# Patient Record
Sex: Female | Born: 1965 | Race: White | Hispanic: No | Marital: Single | Smoking: Current every day smoker
Health system: Southern US, Community
[De-identification: ages and names within clinical notes are randomized; demographics above are authoritative.]

## PROBLEM LIST (undated history)

## (undated) DIAGNOSIS — G500 Trigeminal neuralgia: Secondary | ICD-10-CM

## (undated) DIAGNOSIS — G629 Polyneuropathy, unspecified: Secondary | ICD-10-CM

## (undated) HISTORY — PX: OTHER SURGICAL HISTORY: SHX169

## (undated) HISTORY — PX: ABDOMINAL HYSTERECTOMY: SHX81

---

## 2004-07-12 ENCOUNTER — Emergency Department: Payer: Self-pay | Admitting: Emergency Medicine

## 2004-07-13 ENCOUNTER — Ambulatory Visit: Payer: Self-pay | Admitting: Emergency Medicine

## 2004-09-22 ENCOUNTER — Inpatient Hospital Stay: Payer: Self-pay | Admitting: Unknown Physician Specialty

## 2004-09-29 ENCOUNTER — Ambulatory Visit: Payer: Self-pay | Admitting: Unknown Physician Specialty

## 2004-10-07 ENCOUNTER — Ambulatory Visit: Payer: Self-pay | Admitting: Unknown Physician Specialty

## 2005-08-26 ENCOUNTER — Emergency Department: Payer: Self-pay | Admitting: Emergency Medicine

## 2006-11-10 ENCOUNTER — Emergency Department: Payer: Self-pay | Admitting: Emergency Medicine

## 2007-06-07 ENCOUNTER — Emergency Department: Payer: Self-pay | Admitting: Emergency Medicine

## 2013-02-16 ENCOUNTER — Inpatient Hospital Stay: Payer: Self-pay | Admitting: Internal Medicine

## 2013-02-16 LAB — APTT: Activated PTT: 45 secs — ABNORMAL HIGH (ref 23.6–35.9)

## 2013-02-16 LAB — CBC
HGB: 13.3 g/dL (ref 12.0–16.0)
MCH: 31.3 pg (ref 26.0–34.0)
MCV: 93 fL (ref 80–100)
RDW: 13.8 % (ref 11.5–14.5)

## 2013-02-16 LAB — COMPREHENSIVE METABOLIC PANEL
Albumin: 4.1 g/dL (ref 3.4–5.0)
BUN: 11 mg/dL (ref 7–18)
Bilirubin,Total: 0.5 mg/dL (ref 0.2–1.0)
Calcium, Total: 8.6 mg/dL (ref 8.5–10.1)
Co2: 19 mmol/L — ABNORMAL LOW (ref 21–32)
EGFR (African American): >60
Osmolality: 277 (ref 275–301)
SGOT(AST): 27 U/L (ref 15–37)
SGPT (ALT): 19 U/L (ref 12–78)
Sodium: 138 mmol/L (ref 136–145)
Total Protein: 8.1 g/dL (ref 6.4–8.2)

## 2013-02-16 LAB — URINALYSIS, COMPLETE
Bilirubin,UR: NEGATIVE
Leukocyte Esterase: NEGATIVE
Nitrite: NEGATIVE
Ph: 5 (ref 4.5–8.0)
Protein: NEGATIVE
Specific Gravity: 1.008 (ref 1.003–1.030)
Squamous Epithelial: 4

## 2013-02-16 LAB — ETHANOL
Ethanol %: <0.003 % (ref 0.000–0.080)
Ethanol: <3 mg/dL

## 2013-02-16 LAB — DRUG SCREEN, URINE
Amphetamines, Ur Screen: NEGATIVE (ref ?–1000)
Barbiturates, Ur Screen: NEGATIVE (ref ?–200)
Benzodiazepine, Ur Scrn: NEGATIVE (ref ?–200)
Cannabinoid 50 Ng, Ur ~~LOC~~: NEGATIVE (ref ?–50)
MDMA (Ecstasy)Ur Screen: NEGATIVE (ref ?–500)
Opiate, Ur Screen: NEGATIVE (ref ?–300)
Phencyclidine (PCP) Ur S: NEGATIVE (ref ?–25)

## 2013-02-16 LAB — SALICYLATE LEVEL
Salicylates, Serum: 55.3 mg/dL
Salicylates, Serum: 48.6 mg/dL

## 2013-02-16 LAB — PROTIME-INR
INR: 1.2
Prothrombin Time: 15.3 secs — ABNORMAL HIGH (ref 11.5–14.7)

## 2013-02-16 LAB — ACETAMINOPHEN LEVEL: Acetaminophen: <2 ug/mL

## 2013-02-16 LAB — URINE PH: Ph: 5 (ref 4.5–8.0)

## 2013-02-16 LAB — AMMONIA: Ammonia, Plasma: <25 mcmol/L (ref 11–32)

## 2013-02-17 LAB — BASIC METABOLIC PANEL
Anion Gap: 5 — ABNORMAL LOW (ref 7–16)
Calcium, Total: 8.3 mg/dL — ABNORMAL LOW (ref 8.5–10.1)
Chloride: 111 mmol/L — ABNORMAL HIGH (ref 98–107)
Creatinine: 0.74 mg/dL (ref 0.60–1.30)
EGFR (African American): >60
EGFR (Non-African Amer.): >60
Glucose: 126 mg/dL — ABNORMAL HIGH (ref 65–99)
Osmolality: 283 (ref 275–301)
Potassium: 2.8 mmol/L — ABNORMAL LOW (ref 3.5–5.1)
Sodium: 142 mmol/L (ref 136–145)

## 2013-02-17 LAB — CBC WITH DIFFERENTIAL/PLATELET
Basophil #: 0 10*3/uL (ref 0.0–0.1)
Eosinophil #: 0 10*3/uL (ref 0.0–0.7)
Eosinophil %: 0.1 %
HCT: 34.2 % — ABNORMAL LOW (ref 35.0–47.0)
MCH: 32.4 pg (ref 26.0–34.0)
MCV: 91 fL (ref 80–100)
Monocyte #: 0.5 x10 3/mm (ref 0.2–0.9)
Neutrophil %: 89.8 %
RDW: 13.9 % (ref 11.5–14.5)
WBC: 11.9 10*3/uL — ABNORMAL HIGH (ref 3.6–11.0)

## 2013-02-17 LAB — URINE PH
Ph: 6 (ref 4.5–8.0)
Ph: 6 (ref 4.5–8.0)
Ph: 6 (ref 4.5–8.0)
Ph: 6 (ref 4.5–8.0)
Ph: 6 (ref 4.5–8.0)
Ph: 6 (ref 4.5–8.0)
Ph: 6 (ref 4.5–8.0)

## 2013-02-17 LAB — HEMOGLOBIN A1C: Hemoglobin A1C: 4.9 % (ref 4.2–6.3)

## 2013-02-17 LAB — TROPONIN I
Troponin-I: <0.02 ng/mL
Troponin-I: <0.02 ng/mL

## 2013-02-17 LAB — URINE CULTURE

## 2013-02-17 LAB — SALICYLATE LEVEL
Salicylates, Serum: 29.6 mg/dL — ABNORMAL HIGH
Salicylates, Serum: 16.7 mg/dL — ABNORMAL HIGH

## 2013-02-18 LAB — TROPONIN I: Troponin-I: <0.02 ng/mL

## 2013-02-18 LAB — POTASSIUM: Potassium: 3 mmol/L — ABNORMAL LOW (ref 3.5–5.1)

## 2013-02-18 LAB — SALICYLATE LEVEL: Salicylates, Serum: 3.6 mg/dL — ABNORMAL HIGH

## 2013-02-22 LAB — CULTURE, BLOOD (SINGLE)

## 2014-12-20 NOTE — H&P (Signed)
PATIENT NAME:  Janalyn ShyBAKER, Arcenia A MR#:  [id number] DATE OF BIRTH:  [birth year]  DATE OF ADMISSION:  02/16/2013  PRIMARY CARE PHYSICIAN:  Dr. Quillian QuinceBliss  CHIEF COMPLAINT:  Altered mental status.   HISTORY OF PRESENT ILLNESS: This is a 49 year old female who was brought in by the police because she was swerving on the road. She was arrested for possible intoxication, though when she was at the jail, she was falling asleep at the jail and brought to the ER for medical clearance prior to going back to the jail. In the ER, she was found to have a very elevated salicylate level. She was given charcoal, and bicarb was ordered in a drip, and hospitalist services were contacted for further evaluation. As per the ER physician and the mother, patient has been taking BC powder for trigeminal neuralgia, likely a chronic intoxication. At the time I did see the patient, the patient was very drowsy, moving her arms in the air, and then falling asleep very easily. Unable to get any history from her. History obtained from mother on the phone.   PAST MEDICAL HISTORY:  Trigeminal neuralgia.   PAST SURGICAL HISTORY:  Cyst on the head.   ALLERGIES: In the computer listed as CODEINE GARLIC, SULFA and ONIONS.   MEDICATIONS INCLUDE: Neurontin 600 mg 4 times a day, and BC powder. Unclear how often she is taking it.   SOCIAL HISTORY:  She is a smoker. No alcohol 2 to 3 years. No drug use. 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 She lives with her mother and grandmother.   FAMILY HISTORY:  Mother with hypertension. Father died of lung cancer metastatic to the brain.   REVIEW OF SYSTEMS:  Unable to obtain secondary to altered mental status.   PHYSICAL EXAMINATION: VITAL SIGNS: Temperature 99.4, pulse 113, respirations 20, blood pressure 102/50, pulse ox 95% on room air.  GENERAL:  No respiratory distress, very lethargic.  EYES:  Conjunctivae and lids normal. Pupils equal, round, reactive to light. Unable to test extraocular muscles.   EARS, NOSE, MOUTH AND THROAT:  Tympanic membranes:  No erythema. Nasal mucosa: No erythema. Throat:  No erythema, no exudate seen. Lips and gums:  No lesions.  NECK: No JVD. No bruits. No lymphadenopathy. No thyromegaly. No thyroid nodules palpated.  RESPIRATORY: Lungs clear to auscultation. No use of accessory muscles to breathe. No rhonchi, rales or wheeze heard.  CARDIOVASCULAR SYSTEM: S1, S2. Tachycardic. No gallops, rubs or murmurs heard. Carotid upstroke 2+ bilaterally. No bruits. Dorsalis pedis pulses 2+ bilaterally. No edema of the lower extremity.  ABDOMEN: Soft, nontender. No organomegaly/splenomegaly. Normoactive bowel sounds. No masses felt.  LYMPHATIC:  No lymph nodes in the neck.  MUSCULOSKELETAL: No clubbing, edema or cyanosis.  SKIN:  No ulcers seen.  NEUROLOGIC: Unable to do cranial nerve testing, but patient was moving all of her extremities on her own. Very lethargic. Awakens and does answer some questions, but is hard to understand.  PSYCHIATRIC:  Unable to test secondary to altered mental status.   LABORATORY AND RADIOLOGICAL DATA:  INR 1.2, PTT 45. Salicylates 55.3. White blood cell count 21.5, H and H 13.3 and 39.2, platelet count of 373. Ethanol level negative. Glucose 134, BUN 11, creatinine 0.92, sodium 138, potassium 3.5, chloride 112, CO2 of 19, calcium 8.6. Liver function tests normal. Acetaminophen level negative. Urinalysis:  1+ blood, 1+ ketones. Urine toxicology negative. Initial CT scan of the head showed findings in the right cerebellar hemisphere and further evaluation with IV contrasted study  needed. A repeat CT scan of the head with contrast showed unremarkable CT scan of the head. No evidence of enhancing mass within the posterior fossa. Chest x-ray negative. ABG showed a pH of 7.39, pCO2 of 28, pO2 of 66, bicarb 16.7, O2 saturation 93.2. EKG:  Sinus tachycardia, left atrial enlargement, poor R-wave progression, flattening of T wave laterally.   ASSESSMENT AND  PLAN: 1.  Acute encephalopathy. I think this is secondary to the salicylate poisoning, which a side effect of altered mental status can happen via 3 mechanisms as a direct toxicity of salicylate, neuroglycopenia and cerebral edema. CT scan did not show cerebral edema. I think this is all secondary to the toxicity of the salicylates. Will have to watch clinically, and I need to watch this patient in the CCU.    2.  Salicylate poisoning. The patient did receive charcoal in the Emergency Room. At this point, with the altered mental status, I do want to keep her n.p.o. I do have to watch her vital signs closely in the CCU. Nausea medications p.r.n. Will give bicarb drip to alkalize the urine. Watch the mental status closely. Also have to watch for signs of pulmonary edema, arrhythmia, hypovolemia, thrombocytopenia, and liver function. Will get serial salicylate levels and replace hypokalemia.   3.  Systemic inflammatory response syndrome. No signs of infection at this point. Will hold off on antibiotics and monitor vital signs.   4.  Hypokalemia. Replace potassium in the IV.   5.  Impaired fasting glucose. Will also check a hemoglobin A1c in the morning.   Time spent on admission:  55 minutes.   The patient will be admitted to the Critical Care Unit. The patient is critically ill.    ____________________________ Herschell Dimes. Renae Gloss, MD rjw:mr D: 02/16/2013 21:31:01 ET T: 02/16/2013 22:09:01 ET JOB#: [id number]  cc: Herschell Dimes. Renae Gloss, MD, <Dictator> Burley Saver, MD  Salley Scarlet MD ELECTRONICALLY SIGNED 02/26/2013 15:38

## 2014-12-20 NOTE — Consult Note (Signed)
PATIENT NAME:  Alexis Sanford, [name] MR#:  [id number] DATE OF BIRTH:  10-10-1965  DATE OF CONSULTATION:  02/17/2013  DATE OF ADMISSION: 02/16/2013   CONSULTING PHYSICIAN:  Melady Chow K. Billie Intriago, MD  AGE: 48. SEX: Female. RACE: White.  The patient was seen in room number 142 in consultation. The patient was referred for salicylate poisoning, likely chronic ingestion than overdose  The patient is a [age] year old white female, not employed and currently has to take care of her grandmother and lives with her parents. The patient is brought to the Emergency Room from the jail by the police because she was sent to the jail because she was driving erratic on the road, and the patient reports that she has been taking BC Powder to help with her trigeminal neuralgia. The patient has trigeminal neuralgia, for which she is on Neurontin 600 mg 4 times a day and is being followed by Dr. Quillian QuinceBliss, who is her primary care physician.   PAST PSYCHIATRIC HISTORY: No previous history of inpatient hold psychiatry. No history of suicide attempt but was depressed and had suicidal wishes and thoughts in the past, for which she went to therapy.  ALCOHOL AND DRUGS: Denies. Does admit smoking nicotine cigarettes at a rate of a pack a day for many years.  MENTAL STATUS: The patient is seen lying comfortably in bed. Alert and oriented. Aware of the situation that brought her for [facility] to [facility]. Denies feeling depressed. Denies feeling hopeless or helpless. Denies feeling worthless or useless. No psychosis. Cognition is intact. Memory is intact. Denies any ideas or plans to hurt herself or others. Insight and judgment fair.  IMPRESSION: Salicylate level increased secondary to patient taking it for trigeminal neuralgia and chronic pain, and denies any intentions to hurt herself.   RECOMMENDATIONS: Please discharge the patient when she is medically cleared and stable, as she contracts for safety, and she may go for followup in the local  community at the local mental clinic if she wishes to get help with consultation and therapy.    ____________________________ Jannet MantisSurya K. Guss Bundehalla, MD skc:jm D: 02/17/2013 20:06:18 ET T: 02/17/2013 20:59:58 ET JOB#: [id number]  cc: Monika SalkSurya K. Guss Bundehalla, MD, <Dictator> Beau FannySURYA K Teneisha Gignac MD ELECTRONICALLY SIGNED 02/18/2013 18:27

## 2014-12-20 NOTE — Discharge Summary (Signed)
PATIENT NAME:  Alexis Sanford, Alexis Sanford MR#:  [id number] DATE OF BIRTH:  10/24/1965  DATE OF ADMISSION:  02/16/2013 DATE OF DISCHARGE:  02/18/2013  ADMITTING PHYSICIAN: Alford Highland, MD  DISCHARGING PHYSICIAN: Enid Baas, MD  PRIMARY CARE PHYSICIAN: Clayborn Bigness, MD  CONSULTANTS:  In the hospital: Psychiatric by Dr. Guss Bunde.   DISCHARGE DIAGNOSES: 1.  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  2.  Chronic obstructive pulmonary disease exacerbation.  3.  Tobacco use disorder.  4.  Hypokalemia.  5.  Chronic headaches and trigeminal neuralgia.   DISCHARGE HOME MEDICATIONS: 1.  Gabapentin 600 mg p.o. 4 times a day.  2.  Percocet 5/325 mg 1 to 2 tablets every 6 hours as needed for pain.  3.  Albuterol inhaler 2 puffs q. 6 hours p.r.n. for breathing. 4.  Prednisone taper.   DISCHARGE DIET: Regular diet.   DISCHARGE ACTIVITY: As tolerated.  FOLLOWUP INSTRUCTIONS: Follow up with PCP in 1 to 2 weeks.  LABORATORY AND DIAGNOSTICS:  Prior to discharge: Salicylate level came down to 3.6.  Initial admission level of salicylate was elevated at 48.6. Troponins are negative. WBC 11.9, hemoglobin 12.2, hematocrit 34.2 and platelet count 320. Sodium 142, potassium after replacements came down to 3, chloride 111, bicarb 26, BUN 9, creatinine 0.74, glucose 126 and calcium of 8.3. Hemoglobin A1c 4.9. Blood cultures are negative.   CT of the head with contrast done showing unremarkable CT. No evidence of any enhancing masses in the posterior fossa. This was done because initial CT of head without contrast showed possible findings in the right cerebellar hemisphere and contrast is recommended to rule out any masses.   Last ammonia level was negative, less than 25. Urine tox screen negative. Urinalysis negative for any infection. Acetaminophen level was less than 2.  Alcohol level was negative.  BRIEF HOSPITAL COURSE: Alexis Sanford is a 49 year old female with past medical history  significant for trigeminal neuralgia and chronic headaches for which she uses prescription gabapentin and also has been using over the counter [facility] Powders, about 6 packets for more than 15 years. She has been using a lot lately, however, she was found by the side of the road by police secondary to confusion and altered mental status.  1.  Acute encephalopathy secondary to toxic metabolic encephalopathy. Salicylate level was elevated to more than 48 on admission. CT of the head with and without contrast were negative. Alcohol level was negative. Not sure what triggered it, probably acute on chronic salicylate poisoning. She was hypothermic secondary to that.  She was placed on bicarb drip to alkalize her urine and salicylate excretion. Salicylate level came down to 3.6 by the time of discharge. Her white count was elevated, initially thought to be systemic inflammatory response syndrome. No source of infection identified. Everything was probably secondary stress reaction. Her mental status improved as her salicylate level started improving and she was back to normal. Now that was advised against BC Powders she was started on some Percocet for headache in the hospital and is discharged on some pills at this time. She is also on gabapentin for the same.  2.  Hypokalemia. Potassium was low and that was replaced, likely secondary to alkalosis caused by the bicarb drip.   3.  Tobacco use disorder. The patient was strongly counseled at the that time of discharge, but she was not ready to quit smoking at this time.  4.  COPD exacerbation. She had severe wheezing on admission. She was never diagnosed  with COPD nor used any inhalers at home, but she has chronic history of smoking. She improved with IV Solu-Medrol and inhalers while in the hospital, so she is being discharged on prednisone taper and albuterol inhaler as needed.   Her course has been otherwise uneventful in the hospital.   DISCHARGE CONDITION: Stable.    DISCHARGE DISPOSITION: Home.   TIME SPENT ON DISCHARGE: 40 minutes.  ____________________________ Enid Baasadhika Smt Lokey, MD rk:sb D: 02/19/2013 12:47:46 ET T: 02/19/2013 12:59:04 ET JOB#: [id number]  cc: Enid Baasadhika Texie Tupou, MD, <Dictator> Burley SaverL. Katherine Bliss, MD Enid BaasADHIKA Almin Livingstone MD ELECTRONICALLY SIGNED 02/19/2013 20:41

## 2015-12-21 ENCOUNTER — Emergency Department
Admission: EM | Admit: 2015-12-21 | Discharge: 2015-12-21 | Disposition: A | Discharge status: Home / Self Care | Payer: Self-pay | Attending: Emergency Medicine | Admitting: Emergency Medicine

## 2015-12-21 ENCOUNTER — Encounter: Payer: Self-pay | Admitting: Emergency Medicine

## 2015-12-21 DIAGNOSIS — F17210 Nicotine dependence, cigarettes, uncomplicated: Secondary | ICD-10-CM | POA: Insufficient documentation

## 2015-12-21 DIAGNOSIS — G500 Trigeminal neuralgia: Secondary | ICD-10-CM | POA: Insufficient documentation

## 2015-12-21 DIAGNOSIS — K122 Cellulitis and abscess of mouth: Secondary | ICD-10-CM | POA: Insufficient documentation

## 2015-12-21 HISTORY — DX: Trigeminal neuralgia: G50.0

## 2015-12-21 MED ORDER — PREDNISONE 20 MG PO TABS: ORAL_TABLET | ORAL | Status: DC

## 2015-12-21 MED ORDER — AMOXICILLIN 500 MG PO TABS
500.0000 mg | ORAL_TABLET | Freq: Three times a day (TID) | ORAL | Status: DC
Start: 2015-12-21 — End: 2019-06-29

## 2015-12-21 NOTE — ED Provider Notes (Signed)
[facility] Emergency Department Provider Note  ____________________________________________  Time seen: Approximately 7:16 AM  I have reviewed the triage vital signs and the nursing notes.   HISTORY  Chief Complaint Dental Pain    HPI Alexis Sanford is a 50 y.o. female , NAD, presents to the emergency department with 2-3 day history of right upper gum line pain. States she had 2 teeth removed on Thursday at [facility]. Has continued to have pain since that time even though she has been taking Percocet for her pain. States it has not been helping. Also notes she has right sided trigeminal neuralgia and feels like that has been aggravated from her dental procedure. She is under pain contract with her primary care physician [place]. [place] for her trigeminal neuralgia. Has spoken to her dentist at [facility] who has advised her to seek treatment in the emergency department for pain and to evaluate for any potential infection. Patient notes she has a history of dry socket after dental procedures but is uncertain if she has one at this time. Is not currently on any prophylactic antibiotics. Does continue to smoke cigarettes since her procedure. It was completely mouth rinses but is not swishing and is only allowing the rest to be held in the mouth. Has not had any fevers, chills, body aches. She does feel some fatigue and has been sleeping more often over the last couple of days. Feels she "just can't get herself going".    Past Medical History  Diagnosis Date  . Trigeminal neuralgia     There are no active problems to display for this patient.   Past Surgical History  Procedure Laterality Date  . Abdominal hysterectomy      partail    Current Outpatient Rx  Name  Route  Sig  Dispense  Refill  . amoxicillin (AMOXIL) 500 MG tablet   Oral   Take 1 tablet (500 mg total) by mouth 3 (three) times daily with meals.   30  tablet   0   . predniSONE (DELTASONE) 20 MG tablet      Take 2 tablets by mouth, once daily, for 5 days   10 tablet   0     Allergies Asa and Sulfa antibiotics  No family history on file.  Social History Social History  Substance Use Topics  . Smoking status: Current Every Day Smoker -- 0.50 packs/day    Types: Cigarettes  . Smokeless tobacco: None  . Alcohol Use: None     Review of Systems Constitutional: Positive fatigue. No fever/chills ENT: Positive right upper gum line pain. No sore throat, tongue swelling. Cardiovascular: No chest pain. Respiratory: No cough. No shortness of breath. No wheezing.  Gastrointestinal: No abdominal pain.  No nausea, vomiting.   Musculoskeletal: Positive pain about right upper jaw line extending to temple. Negative for neck pain. No claudication with mastication.  Skin: Negative for rash. Neurological: Negative for headaches, focal weakness or numbness. 10-point ROS otherwise negative.  ____________________________________________   PHYSICAL EXAM:  VITAL SIGNS: ED Triage Vitals  Enc Vitals Group     BP 12/21/15 0627 142/73 mmHg     Pulse Rate 12/21/15 0627 99     Resp 12/21/15 0627 18     Temp 12/21/15 0627 98.2 F (36.8 C)     Temp Source 12/21/15 0627 Oral     SpO2 12/21/15 0627 100 %     Weight 12/21/15 0627 195  lb (88.451 kg)     Height 12/21/15 0627  (1.676 m)     Head Cir --      Peak Flow --      Pain Score 12/21/15 0627 10     Pain Loc --      Pain Edu? --      Excl. in GC? --      Constitutional: Alert and oriented. Well appearing and in no acute distress. Eyes: Conjunctivae are normal. PERRL. EOMI without pain.  Head: Atraumatic. ENT:      Nose: No congestion/rhinnorhea.      Mouth/Throat: Moderate erythema surrounding the extraction sites in the upper right gum line. Patient is tender to palpation in this area. No active oozing or weeping. Mild swelling is noted. No evidence of dry socket at this  time. Mucous membranes are moist. Pharynx without erythema, exudate, swelling. Uvula is midline. Neck: No stridor. Supple with full range of motion. Hematological/Lymphatic/Immunilogical: No cervical lymphadenopathy. Cardiovascular: Normal rate, regular rhythm. Normal S1 and S2.  Good peripheral circulation. Respiratory: Normal respiratory effort without tachypnea or retractions. Lungs CTAB with breath sounds noted in all lung fields. Neurologic:  Normal speech and language. No gross focal neurologic deficits are appreciated.  Skin:  Skin is warm, dry and intact. No rash noted. Psychiatric: Mood and affect are normal. Speech and behavior are normal. Patient exhibits appropriate insight and judgement.   ____________________________________________   LABS  None  ____________________________________________  EKG  None ____________________________________________  RADIOLOGY  None ____________________________________________    PROCEDURES  Procedure(s) performed: None    Medications - No data to display  Per the [facility] controlled substance database, the patient receives 60 tablets of oxycodone-acetaminophen 5/325 mg from Dr. Joen Laura in medicine [place]. #60 tablets were last dispensed on 12/15/2015. ____________________________________________   INITIAL IMPRESSION / ASSESSMENT AND PLAN / ED COURSE  Patient's diagnosis is consistent with cellulitis oral soft tissues and trigeminal neuralgia pain. Discussed with patient that I would not prescribe narcotic pain medicines for her at this time considering she is under narcotic pain contract with her primary care provider. She also just filled #60 tablets of Percocet on April 17. Patient will be discharged home with prescriptions for amoxicillin and prednisone to take as directed to decrease the potential infection as well as to decrease swelling and inflammation in the area of her extraction. Patient is to follow  up with her dentist at the [facility] tomorrow for recheck. She is to follow up with her primary care provider if trigeminal neuralgia pain is unsettled with current medication use.  Patient is given ED precautions to return to the ED for any worsening or new symptoms.      ____________________________________________  FINAL CLINICAL IMPRESSION(S) / ED DIAGNOSES  Final diagnoses:  Cellulitis of oral soft tissues  Trigeminal neuralgia pain      NEW MEDICATIONS STARTED DURING THIS VISIT:  Discharge Medication List as of 12/21/2015  7:32 AM    START taking these medications   Details  amoxicillin (AMOXIL) 500 MG tablet Take 1 tablet (500 mg total) by mouth 3 (three) times daily with meals., Starting 12/21/2015, Until Discontinued, Print    predniSONE (DELTASONE) 20 MG tablet Take 2 tablets by mouth, once daily, for 5 days, Print             Hope Pigeon, PA-C 12/21/15 0810  Myrna Blazer, MD 12/21/15 1620

## 2015-12-21 NOTE — ED Notes (Signed)
Patient here stating that she had 2 teeth removed on Thursday. Patient states that she continues to have pain. Patient states that she was prescribed 10 mg percocet and that she has ran out but that they did not help. Patient reports that she has trigeminal neurologia.

## 2015-12-21 NOTE — ED Notes (Signed)
NAD noted at time of D/C. Pt denies questions or concerns. Pt ambulatory to the lobby at this time. Pt instructed to continue medications, and to follow up with pain management for pain medications. Pt states understanding of taking abx and prednisone.

## 2015-12-21 NOTE — Discharge Instructions (Signed)
Trigeminal Neuralgia Trigeminal neuralgia is a nerve disorder that causes attacks of severe facial pain. The attacks last from a few seconds to several minutes. They can happen for days, weeks, or months and then go away for months or years. Trigeminal neuralgia is also called tic douloureux. CAUSES This condition is caused by damage to a nerve in the face that is called the trigeminal nerve. An attack can be triggered by:  Talking.  Chewing.  Putting on makeup.  Washing your face.  Shaving your face.  Brushing your teeth.  Touching your face. RISK FACTORS This condition is more likely to develop in:  Women.  People who are 35 years of age or older. SYMPTOMS The main symptom of this condition is pain in the jaw, lips, eyes, nose, scalp, forehead, and face. The pain may be intense, stabbing, electric, or shock-like. DIAGNOSIS This condition is diagnosed with a physical exam. A CT scan or MRI may be done to rule out other conditions that can cause facial pain. TREATMENT This condition may be treated with:  Avoiding the things that trigger your attacks.  Pain medicine.  Surgery. This may be done in severe cases if other medical treatment does not provide relief. HOME CARE INSTRUCTIONS  Take over-the-counter and prescription medicines only as told by your health care provider.  If you wish to get pregnant, talk with your health care provider before you start trying to get pregnant.  Avoid the things that trigger your attacks. It may help to:  Chew on the unaffected side of your mouth.  Avoid touching your face.  Avoid blasts of hot or cold air. SEEK MEDICAL CARE IF:  Your pain medicine is not helping.  You develop new, unexplained symptoms, such as:  Double vision.  Facial weakness.  Changes in hearing or balance.  You become pregnant. SEEK IMMEDIATE MEDICAL CARE IF:  Your pain is unbearable, and your pain medicine does not help.   This information is not  intended to replace advice given to you by your health care provider. Make sure you discuss any questions you have with your health care provider.   Document Released: 08/13/2000 Document Revised: 05/07/2015 Document Reviewed: 12/09/2014 Elsevier Interactive Patient Education 2016 Elsevier Inc.   Dental Abscess    A dental abscess is pus in or around a tooth.  HOME CARE  Take medicines only as told by your dentist.  If you were prescribed antibiotic medicine, finish all of it even if you start to feel better.  Rinse your mouth (gargle) often with salt water.  Do not drive or use heavy machinery, like a lawn mower, while taking pain medicine.  Do not apply heat to the outside of your mouth.  Keep all follow-up visits as told by your dentist. This is important. GET HELP IF:  Your pain is worse, and medicine does not help. GET HELP RIGHT AWAY IF:  You have a fever or chills.  Your symptoms suddenly get worse.  You have a very bad headache.  You have problems breathing or swallowing.  You have trouble opening your mouth.  You have puffiness (swelling) in your neck or around your eye. This information is not intended to replace advice given to you by your health care provider. Make sure you discuss any questions you have with your health care provider.  Document Released: 12/31/2014 Document Reviewed: 12/31/2014  Elsevier Interactive Patient Education 2016 Elsevier Inc.    Dental Dry Socket    A dental dry socket can  happen after a tooth is pulled. When a tooth gets pulled, it leaves a hole (socket). Normally, blood fills up the hole and hardens (clots). A dry socket happens if blood gets removed from the hole or does not fill the hole.  HOME CARE  Follow your dentist's instructions.  Only take medicines as told by your dentist.  Take your medicine (antibiotics) as told if you are given medicine. Finish them even if you start to feel better.  Wait 1 day to rinse your mouth with  warm salt water. Spit water out very gently.  Avoid bubbly (carbonated) drinks.  Avoid alcohol.  Avoid smoking. GET HELP RIGHT AWAY IF:  Your medicine does not help your pain.  You have puffiness (swelling), severe pain, or you cannot stop bleeding.  You have a temperature by mouth above 102 F (38.9 C), not controlled by medicine.  You have trouble swallowing or cannot open your mouth.  You have severe problems (symptoms). MAKE SURE YOU:  Understand these instructions.  Will watch your condition.  Will get help right away if you are not doing well or get worse. This information is not intended to replace advice given to you by your health care provider. Make sure you discuss any questions you have with your health care provider.  Document Released: 08/16/2005 Document Revised: 09/06/2014 Document Reviewed: 03/31/2015  Elsevier Interactive Patient Education [employer] Elsevier Inc.

## 2016-04-16 ENCOUNTER — Emergency Department: Payer: Self-pay

## 2016-04-16 ENCOUNTER — Emergency Department
Admission: EM | Admit: 2016-04-16 | Discharge: 2016-04-16 | Disposition: A | Discharge status: Home / Self Care | Payer: Self-pay | Attending: Emergency Medicine | Admitting: Emergency Medicine

## 2016-04-16 ENCOUNTER — Encounter (INDEPENDENT_AMBULATORY_CARE_PROVIDER_SITE_OTHER): Payer: Self-pay

## 2016-04-16 ENCOUNTER — Encounter: Payer: Self-pay | Admitting: Emergency Medicine

## 2016-04-16 DIAGNOSIS — S62632A Displaced fracture of distal phalanx of right middle finger, initial encounter for closed fracture: Secondary | ICD-10-CM | POA: Insufficient documentation

## 2016-04-16 DIAGNOSIS — S62639B Displaced fracture of distal phalanx of unspecified finger, initial encounter for open fracture: Secondary | ICD-10-CM

## 2016-04-16 DIAGNOSIS — S61219A Laceration without foreign body of unspecified finger without damage to nail, initial encounter: Secondary | ICD-10-CM

## 2016-04-16 DIAGNOSIS — F17210 Nicotine dependence, cigarettes, uncomplicated: Secondary | ICD-10-CM | POA: Insufficient documentation

## 2016-04-16 DIAGNOSIS — W228XXA Striking against or struck by other objects, initial encounter: Secondary | ICD-10-CM | POA: Insufficient documentation

## 2016-04-16 DIAGNOSIS — S6000XA Contusion of unspecified finger without damage to nail, initial encounter: Secondary | ICD-10-CM

## 2016-04-16 DIAGNOSIS — Y939 Activity, unspecified: Secondary | ICD-10-CM | POA: Insufficient documentation

## 2016-04-16 DIAGNOSIS — Y929 Unspecified place or not applicable: Secondary | ICD-10-CM | POA: Insufficient documentation

## 2016-04-16 DIAGNOSIS — Y999 Unspecified external cause status: Secondary | ICD-10-CM | POA: Insufficient documentation

## 2016-04-16 MED ORDER — LIDOCAINE HCL (PF) 1 % IJ SOLN
2.0000 mL | Freq: Once | INTRAMUSCULAR | Status: AC
  Administered 2016-04-16: 2 mL
  Filled 2016-04-16: qty 5

## 2016-04-16 MED ORDER — CEPHALEXIN 500 MG PO CAPS: 500.0000 mg | ORAL_CAPSULE | Freq: Three times a day (TID) | ORAL | 0 refills | Status: AC

## 2016-04-16 NOTE — ED Provider Notes (Signed)
[facility]-EMERGENCY DEPARTMENT Provider Note   CSN: [id number] Arrival date & time: 04/16/16  1653     History   Chief Complaint Chief Complaint  Patient presents with  . Finger Injury    HPI Alexis Sanford is a 50 y.o. female who presents today for evaluation of crush injury to the right third digit. Patient slammed her finger in a car door just prior to arrival. Pain is 8 out of 10. She suffered laceration to the tip of the finger. No numbness or tingling. No tendon deficits. Tetanus is up-to-date. Bleeding controlled.  HPI  Past Medical History:  Diagnosis Date  . Trigeminal neuralgia     There are no active problems to display for this patient.   Past Surgical History:  Procedure Laterality Date  . ABDOMINAL HYSTERECTOMY     partail    OB History    No data available       Home Medications    Prior to Admission medications   Medication Sig Start Date End Date Taking? Authorizing Provider  amoxicillin (AMOXIL) 500 MG tablet Take 1 tablet (500 mg total) by mouth 3 (three) times daily with meals. 12/21/15   Jami L Hagler, PA-C  cephALEXin (KEFLEX) 500 MG capsule Take 1 capsule (500 mg total) by mouth 3 (three) times daily. 04/16/16 04/26/16  Evon Slackhomas C Gaines, PA-C  predniSONE (DELTASONE) 20 MG tablet Take 2 tablets by mouth, once daily, for 5 days 12/21/15   Hope PigeonJami L Hagler, PA-C    Family History History reviewed. No pertinent family history.  Social History Social History  Substance Use Topics  . Smoking status: Current Every Day Smoker    Packs/day: 0.50    Types: Cigarettes  . Smokeless tobacco: Not on file  . Alcohol use Not on file     Allergies   Asa [aspirin] and Sulfa antibiotics   Review of Systems Review of Systems  Constitutional: Negative for activity change, chills, fatigue and fever.  HENT: Negative for congestion, sinus pressure and sore throat.   Eyes: Negative for visual disturbance.  Respiratory: Negative for cough, chest tightness and  shortness of breath.   Cardiovascular: Negative for chest pain and leg swelling.  Gastrointestinal: Negative for abdominal pain, diarrhea, nausea and vomiting.  Genitourinary: Negative for dysuria.  Musculoskeletal: Positive for arthralgias and joint swelling. Negative for gait problem.  Skin: Negative for rash.  Neurological: Negative for weakness, numbness and headaches.  Hematological: Negative for adenopathy.  Psychiatric/Behavioral: Negative for agitation, behavioral problems and confusion.     Physical Exam Updated Vital Signs BP 124/74   Pulse 86   Temp 98.5 F (36.9 C) (Oral)   Resp 16   Ht 5\' 6"  (1.676 m)   Wt 82.6 kg   SpO2 98%   BMI 29.38 kg/m   Physical Exam  Constitutional: She is oriented to person, place, and time. She appears well-developed and well-nourished. No distress.  HENT:  Head: Normocephalic and atraumatic.  Mouth/Throat: Oropharynx is clear and moist.  Eyes: EOM are normal. Pupils are equal, round, and reactive to light. Right eye exhibits no discharge. Left eye exhibits no discharge.  Neck: Normal range of motion. Neck supple.  Cardiovascular: Normal rate and intact distal pulses.   Pulmonary/Chest: Effort normal. No respiratory distress.  Musculoskeletal:  Examination of the right third digit shows patient has a volar laceration along the distal pad that is 2 cm transverse, there is no sign of foreign body. Wound is thoroughly irrigated. No subungual hematoma or nail  injury. No tendon deficits noted. She is tender to palpation with mild swelling.  Neurological: She is alert and oriented to person, place, and time. She has normal reflexes.  Skin: Skin is warm and dry.  Psychiatric: She has a normal mood and affect. Her behavior is normal. Thought content normal.     ED Treatments / Results  Labs (all labs ordered are listed, but only abnormal results are displayed) Labs Reviewed - No data to display  EKG  EKG Interpretation None        Radiology Dg Finger Middle Right  Result Date: 04/16/2016 CLINICAL DATA:  Middle finger caught in door. Laceration to the digit. EXAM: RIGHT MIDDLE FINGER 2+V COMPARISON:  None. FINDINGS: Minimally displaced fracture involving the middle finger tuft. No other fractures. Laceration involving the tip of the middle finger. The middle finger PIP and DIP joints are intact. IMPRESSION: Minimally displaced fracture involving the middle finger tuft. Electronically Signed   By: Richarda OverlieAdam  Henn M.D.   On: 04/16/2016 17:28    Procedures Procedures (including critical care time) LACERATION REPAIR Performed by: Patience MuscaGAINES, THOMAS CHRISTOPHER Authorized by: Patience MuscaGAINES, THOMAS CHRISTOPHER Consent: Verbal consent obtained. Risks and benefits: risks, benefits and alternatives were discussed Consent given by: patient Patient identity confirmed: provided demographic data Prepped and Draped in normal sterile fashion Wound explored  Laceration Location: Right third digit  Laceration Length: 2 cm  No Foreign Bodies seen or palpated  Anesthesia: local infiltration  Local anesthetic: lidocaine 1 % without epinephrine  Anesthetic total: 2 ml  Irrigation method: syringe Amount of cleaning: standard  Skin closure: Simple interrupted 4-0 nylon   Number of sutures: 3   Technique: Simple interrupted   Patient tolerance: Patient tolerated the procedure well with no immediate complications.  SPLINT APPLICATION Date/Time: 6:14 PM Authorized by: Patience MuscaGAINES, THOMAS CHRISTOPHER Consent: Verbal consent obtained. Risks and benefits: risks, benefits and alternatives were discussed Consent given by: patient Splint applied by: ED tech Location details: Right third digit  Splint type: Aluminum/foam  Supplies used: Aluminum/foam  Post-procedure: The splinted body part was neurovascularly unchanged following the procedure. Patient tolerance: Patient tolerated the procedure well with no immediate  complications.     Medications Ordered in ED Medications  lidocaine (PF) (XYLOCAINE) 1 % injection 2 mL (2 mLs Infiltration Given 04/16/16 1745)     Initial Impression / Assessment and Plan / ED Course  I have reviewed the triage vital signs and the nursing notes.  Pertinent labs & imaging results that were available during my care of the patient were reviewed by me and considered in my medical decision making (see chart for details).  Clinical Course    50 year old female with crush injury to the right third digit. She suffered laceration and tuft fracture. She is placed on oral antibiotics. Wound is thoroughly irrigated and repaired with #3 4-0 nylon sutures. She will continue with chronic pain medication, recommended adding ibuprofen as needed. She is placed into a finger splint, she will follow-up in 7-10 days for suture removal. She is educated on wound care and signs and symptoms to follow up to the emergency department for.  Final Clinical Impressions(s) / ED Diagnoses   Final diagnoses:  Finger laceration, initial encounter  Open fracture of tuft of distal phalanx of finger, initial encounter  Finger contusion, initial encounter    New Prescriptions New Prescriptions   CEPHALEXIN (KEFLEX) 500 MG CAPSULE    Take 1 capsule (500 mg total) by mouth 3 (three) times daily.  Evon Slack, PA-C 04/16/16 1814    Sharman Cheek, MD 04/16/16 [phone]

## 2016-04-16 NOTE — Discharge Instructions (Signed)
Please keep finger clean and dry. Use a splint to protect the tip of the finger. Take antibiotic as prescribed. Ibuprofen as needed for pain.
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
## 2016-04-16 NOTE — ED Triage Notes (Signed)
Pt slammed 3rd digit right hand in door. Laceration across finger noted.

## 2016-04-16 NOTE — ED Notes (Signed)
Reviewed d/c instructions, follow-up care, and prescription with pt. Pt verbalized understanding 

## 2016-09-20 DIAGNOSIS — G500 Trigeminal neuralgia: Secondary | ICD-10-CM | POA: Insufficient documentation

## 2017-06-01 DIAGNOSIS — G44229 Chronic tension-type headache, not intractable: Secondary | ICD-10-CM | POA: Insufficient documentation

## 2017-10-04 DIAGNOSIS — F17200 Nicotine dependence, unspecified, uncomplicated: Secondary | ICD-10-CM | POA: Insufficient documentation

## 2019-06-29 ENCOUNTER — Other Ambulatory Visit: Payer: Self-pay

## 2019-06-29 ENCOUNTER — Emergency Department: Payer: Self-pay

## 2019-06-29 ENCOUNTER — Emergency Department
Admission: EM | Admit: 2019-06-29 | Discharge: 2019-06-29 | Disposition: A | Discharge status: Home / Self Care | Payer: Self-pay | Attending: Emergency Medicine | Admitting: Emergency Medicine

## 2019-06-29 DIAGNOSIS — Z79899 Other long term (current) drug therapy: Secondary | ICD-10-CM | POA: Insufficient documentation

## 2019-06-29 DIAGNOSIS — M542 Cervicalgia: Secondary | ICD-10-CM | POA: Diagnosis not present

## 2019-06-29 DIAGNOSIS — Z87891 Personal history of nicotine dependence: Secondary | ICD-10-CM | POA: Insufficient documentation

## 2019-06-29 DIAGNOSIS — R202 Paresthesia of skin: Secondary | ICD-10-CM | POA: Diagnosis not present

## 2019-06-29 HISTORY — DX: Polyneuropathy, unspecified: G62.9

## 2019-06-29 MED ORDER — IBUPROFEN 800 MG PO TABS: 800.0000 mg | ORAL_TABLET | Freq: Three times a day (TID) | ORAL | 0 refills | Status: DC | PRN

## 2019-06-29 MED ORDER — HYDROCODONE-ACETAMINOPHEN 5-325 MG PO TABS
1.0000 | ORAL_TABLET | Freq: Once | ORAL | Status: AC
  Administered 2019-06-29: 1 via ORAL
  Filled 2019-06-29: qty 1

## 2019-06-29 MED ORDER — CYCLOBENZAPRINE HCL 10 MG PO TABS
10.0000 mg | ORAL_TABLET | Freq: Once | ORAL | Status: AC
  Administered 2019-06-29: 10 mg via ORAL
  Filled 2019-06-29: qty 1
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
## 2019-06-29 NOTE — Discharge Instructions (Addendum)
You were seen today for acute neck pain, tingling in left arm and tingling in left leg. CT of neck and lumbar spine do not show any acute injury. It looks like you may have a slightly bulging disc at L4-5 but this appears chronic. We recommend rest, heat and massage. Take your Methocarbamol as directed. I have given you a RX for Ibuprofen 800 mg TID prn- take with food. Avoid other NSAID's OTC. Follow up with your PCP in 1 week, sooner if needed
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
## 2019-10-09 NOTE — Progress Notes (Signed)
Patient pre-screened for BCCCP eligibility. Two patient identifiers used for verification that I was speaking to correct patient.  Patient to present directly to [facility] on 10/10/19 at 10:00.

## 2019-10-10 ENCOUNTER — Ambulatory Visit
Admission: RE | Admit: 2019-10-10 | Discharge: 2019-10-10 | Disposition: A | Discharge status: Home / Self Care | Payer: Self-pay | Source: Ambulatory Visit | Attending: Oncology | Admitting: Oncology

## 2019-10-10 ENCOUNTER — Other Ambulatory Visit: Payer: Self-pay

## 2019-10-10 ENCOUNTER — Encounter: Payer: Self-pay | Admitting: *Deleted

## 2019-10-10 ENCOUNTER — Ambulatory Visit: Payer: Self-pay | Attending: Oncology | Admitting: *Deleted

## 2019-10-10 VITALS — BP 129/72 | HR 66 | Temp 96.7°F | Resp 18 | Ht 64.0 in | Wt 131.0 lb

## 2019-10-10 DIAGNOSIS — Z0000 Encounter for general adult medical examination without abnormal findings: Secondary | ICD-10-CM

## 2019-10-10 NOTE — Progress Notes (Signed)
  Subjective:     Patient ID: Alexis Sanford, female   DOB: 1966-02-13, 54 y.o.   MRN: [id number]  HPI   Review of Systems     Objective:   Physical Exam Chest:     Breasts:        Right: No swelling, bleeding, inverted nipple, mass, nipple discharge, skin change or tenderness.        Left: No swelling, bleeding, inverted nipple, mass, nipple discharge, skin change or tenderness.    Lymphadenopathy:     Upper Body:     Right upper body: No supraclavicular or axillary adenopathy.     Left upper body: No supraclavicular or axillary adenopathy.        Assessment:     54 year old White female presents to [facility] for clinical breast exam, baseline mammogram and pap smear.  Clinical breast exam unremarkable.  Taught self breast awareness.  Patient states history of hysterectomy for fibroids.  She does not know if she has her cervix.  She thinks she has one ovary.  I did review her pathology report from 2006, which states the uterus and cervix was removed.  Based on the pathology and BCCCP guidelines, the patient does not require a pap smear since she does not have cervix.  Offered a pelvic exam only, since she does not have a cervix.  She refused a pelvic exam today.  She states if her doctor request she have one, she will have one completed later.  Reviewed that since there is no recommended screening for cervical cancer the only thing we can do is a pelvic exam.  Patient does have a family history of ovarian cancer in a maternal aunt. Patient has been screened for eligibility.  She does not have any insurance, Medicare or Medicaid.  She also meets financial eligibility.   Risk Assessment    Risk Scores      10/10/2019   Last edited by: Alexis Presto, RN   5-year risk: 1.3 %   Lifetime risk: 9.3 %             Plan:     Screening mammogram ordered.  Will follow-up per BCCCP protocol.

## 2019-10-11 ENCOUNTER — Other Ambulatory Visit: Payer: Self-pay | Admitting: *Deleted

## 2019-10-11 DIAGNOSIS — N63 Unspecified lump in unspecified breast: Secondary | ICD-10-CM

## 2019-10-22 ENCOUNTER — Ambulatory Visit
Admission: RE | Admit: 2019-10-22 | Discharge: 2019-10-22 | Disposition: A | Discharge status: Home / Self Care | Payer: Self-pay | Source: Ambulatory Visit | Attending: Oncology | Admitting: Oncology

## 2019-10-22 ENCOUNTER — Other Ambulatory Visit: Payer: Self-pay | Admitting: *Deleted

## 2019-10-22 DIAGNOSIS — N63 Unspecified lump in unspecified breast: Secondary | ICD-10-CM

## 2019-10-22 DIAGNOSIS — R920 Mammographic microcalcification found on diagnostic imaging of breast: Secondary | ICD-10-CM

## 2019-10-29 ENCOUNTER — Ambulatory Visit
Admission: RE | Admit: 2019-10-29 | Discharge: 2019-10-29 | Disposition: A | Discharge status: Home / Self Care | Payer: Self-pay | Source: Ambulatory Visit | Attending: Oncology | Admitting: Oncology

## 2019-10-29 DIAGNOSIS — R920 Mammographic microcalcification found on diagnostic imaging of breast: Secondary | ICD-10-CM | POA: Insufficient documentation

## 2019-10-29 HISTORY — PX: BREAST BIOPSY: SHX20

## 2019-10-30 LAB — SURGICAL PATHOLOGY

## 2019-11-02 ENCOUNTER — Other Ambulatory Visit: Payer: Self-pay | Admitting: *Deleted

## 2019-11-02 ENCOUNTER — Encounter: Payer: Self-pay | Admitting: *Deleted

## 2019-11-02 DIAGNOSIS — N63 Unspecified lump in unspecified breast: Secondary | ICD-10-CM

## 2019-11-02 NOTE — Progress Notes (Signed)
Letter mailed to inform patient of her 6 month follow up mammogram appointment on 05/08/20 @ 9:40.

## 2020-05-08 ENCOUNTER — Ambulatory Visit
Admission: RE | Admit: 2020-05-08 | Discharge: 2020-05-08 | Disposition: A | Discharge status: Home / Self Care | Payer: No Typology Code available for payment source | Source: Ambulatory Visit | Attending: Oncology | Admitting: Oncology

## 2020-05-08 DIAGNOSIS — N63 Unspecified lump in unspecified breast: Secondary | ICD-10-CM

## 2020-06-19 NOTE — Progress Notes (Signed)
Copy to [facility].  Patient scheduled for annual and 6 month follow-up BCCCP appointment 11/26/20.  Appointment reminder mailed.

## 2020-11-26 ENCOUNTER — Ambulatory Visit
Admission: RE | Admit: 2020-11-26 | Discharge: 2020-11-26 | Disposition: A | Discharge status: Home / Self Care | Payer: Self-pay | Source: Ambulatory Visit | Attending: Oncology | Admitting: Oncology

## 2020-11-26 ENCOUNTER — Other Ambulatory Visit: Payer: Self-pay

## 2020-11-26 ENCOUNTER — Encounter: Payer: Self-pay | Admitting: *Deleted

## 2020-11-26 ENCOUNTER — Ambulatory Visit: Payer: Self-pay | Attending: Oncology | Admitting: *Deleted

## 2020-11-26 VITALS — BP 132/67 | HR 68 | Temp 98.2°F | Ht 66.14 in | Wt 161.3 lb

## 2020-11-26 DIAGNOSIS — R920 Mammographic microcalcification found on diagnostic imaging of breast: Secondary | ICD-10-CM

## 2020-11-26 NOTE — Patient Instructions (Signed)
Gave patient hand-out, Women Staying Healthy, Active and Well from BCCCP, with education on breast health, pap smears, heart and colon health. 

## 2020-11-26 NOTE — Progress Notes (Signed)
  Subjective:     Patient ID: Alexis Sanford, female   DOB: May 08, 1966, 55 y.o.   MRN: [id number]  HPI   Review of Systems     Objective:   Physical Exam Chest:  Breasts:     Right: No swelling, bleeding, inverted nipple, mass, nipple discharge, skin change, tenderness, axillary adenopathy or supraclavicular adenopathy.     Left: No swelling, bleeding, inverted nipple, mass, nipple discharge, skin change, tenderness, axillary adenopathy or supraclavicular adenopathy.    Lymphadenopathy:     Upper Body:     Right upper body: No supraclavicular or axillary adenopathy.     Left upper body: No supraclavicular or axillary adenopathy.        Assessment:     55 year female returns to [facility] for 6 month follow up and annual screening for calcification.  Clinical breast exam unremarkable.  Taught self breast awareness.  History of hysterectomy.  Pap deferred per protocol.  Patient has been screened for eligibility.  She does not have any insurance, Medicare or Medicaid.  She also meets financial eligibility.   Risk Assessment    Risk Scores      11/26/2020 10/10/2019   Last edited by: Scarlett Presto, RN Scarlett Presto, RN   5-year risk: 1.3 % 1.3 %   Lifetime risk: 9.1 % 9.3 %            Plan:     Bilateral diagnostic mammogram and ultrasound for follow up of bilateral calcification.  Will follow up per BCCCP protocol.

## 2020-12-01 ENCOUNTER — Encounter: Payer: Self-pay | Admitting: *Deleted

## 2020-12-01 NOTE — Progress Notes (Signed)
Letter mailed to inform patient of her next mammogram on 12/02/21 @ 8:00.

## 2021-09-14 IMAGING — MG DIGITAL SCREENING BILAT W/ TOMO W/ CAD
8 series · 9 of 24 positions shown · non-contrast
Comparison: None.

CLINICAL DATA: Screening.

EXAM:
DIGITAL SCREENING BILATERAL MAMMOGRAM WITH TOMO AND CAD

[R MLO synth-2D]
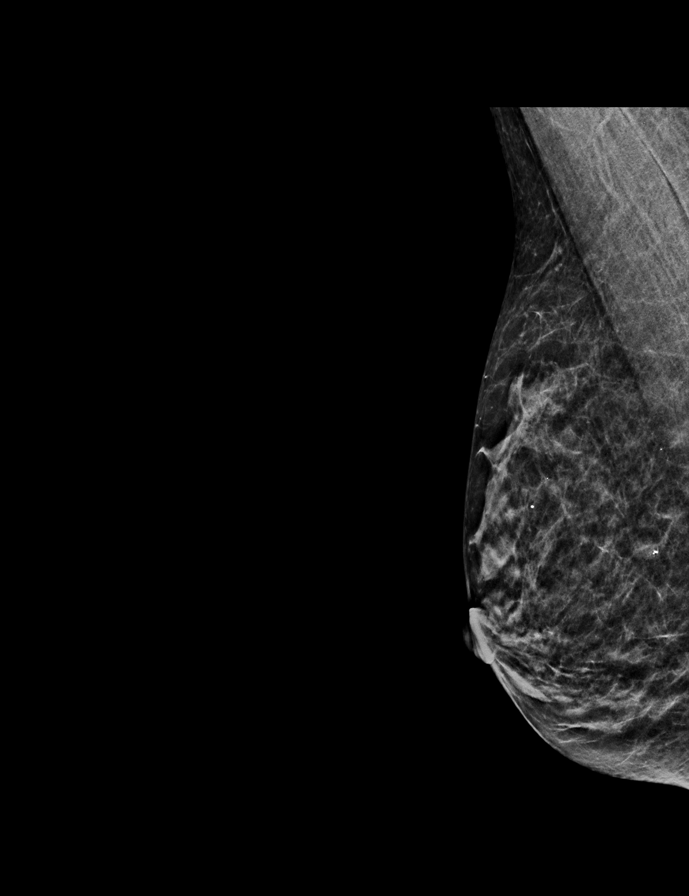

[R CC synth-2D]
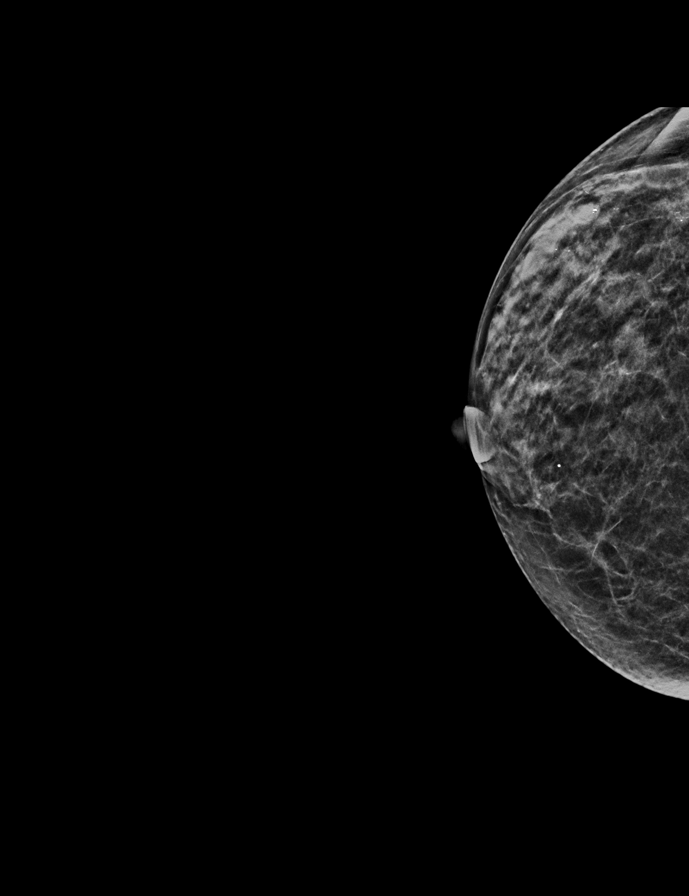

[L MLO synth-2D]
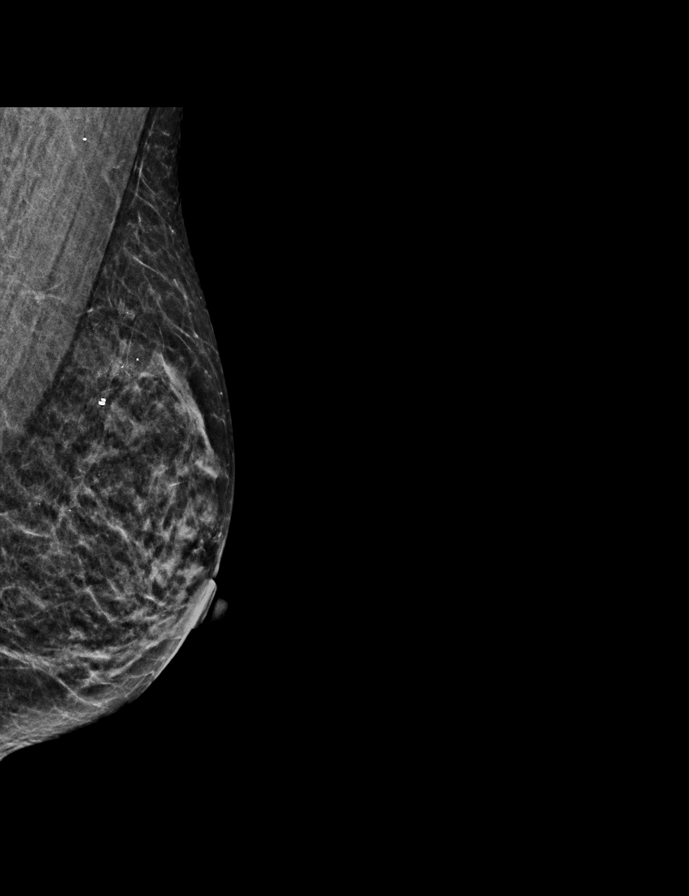

[L CC synth-2D]
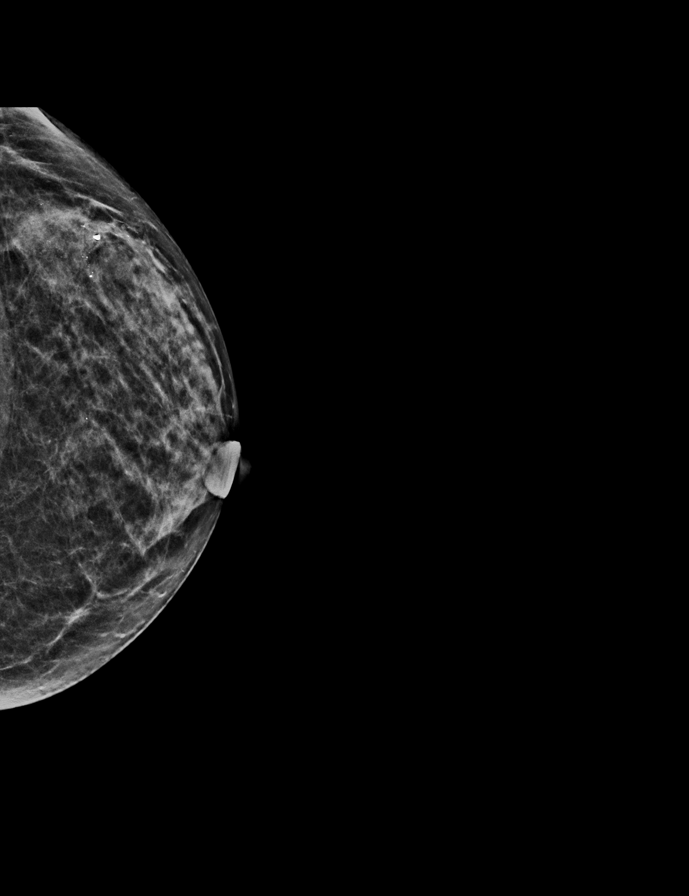

[L MLO tomo · 2 of 45 frames shown]
[frame 15/45]
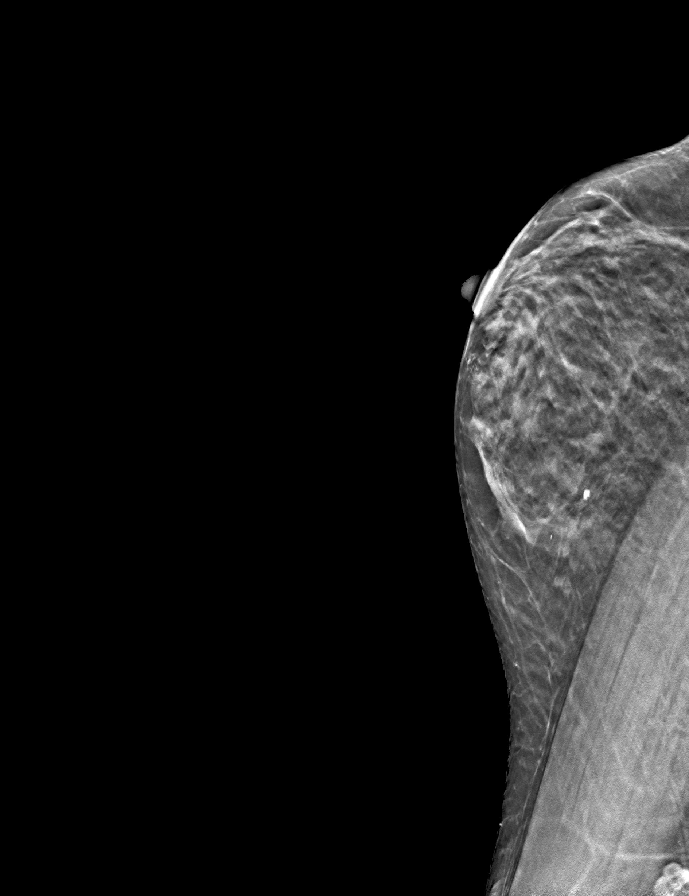
[frame 23/45]
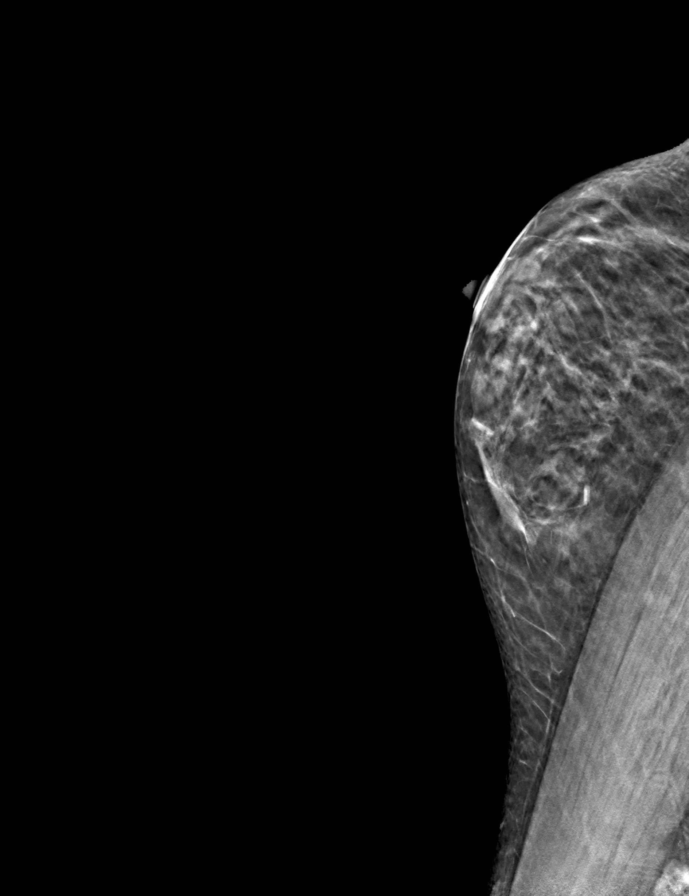

[R MLO tomo · tomo slice 21/40.0]
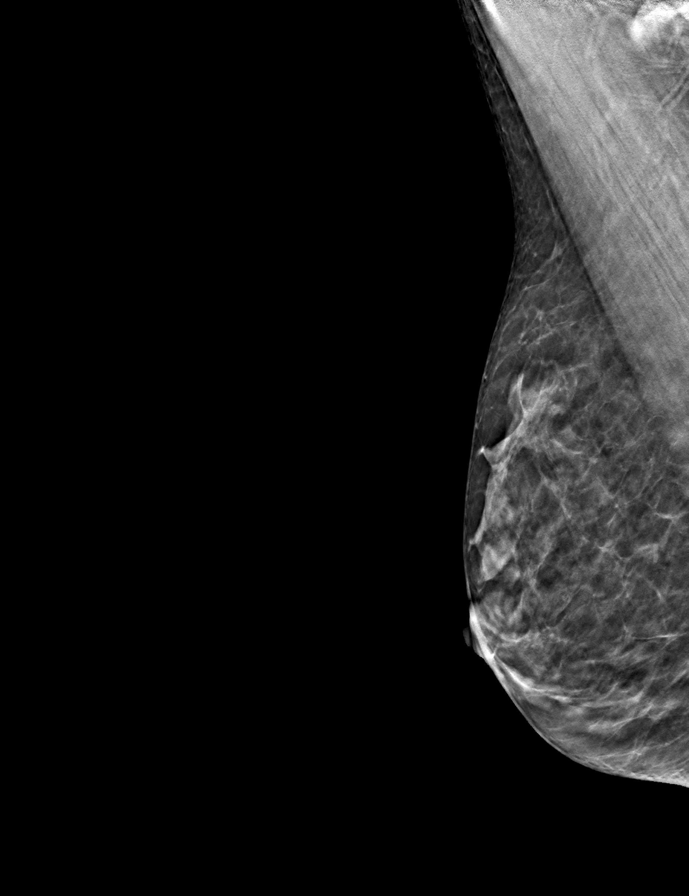

[R CC tomo · tomo slice 19/38.0]
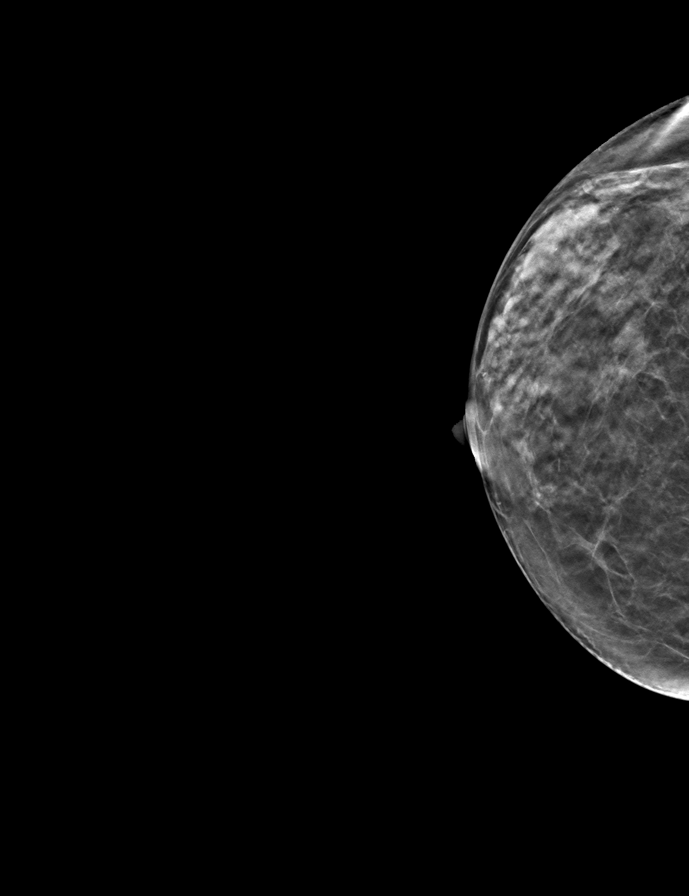

[L CC tomo · tomo slice 21/41.0]
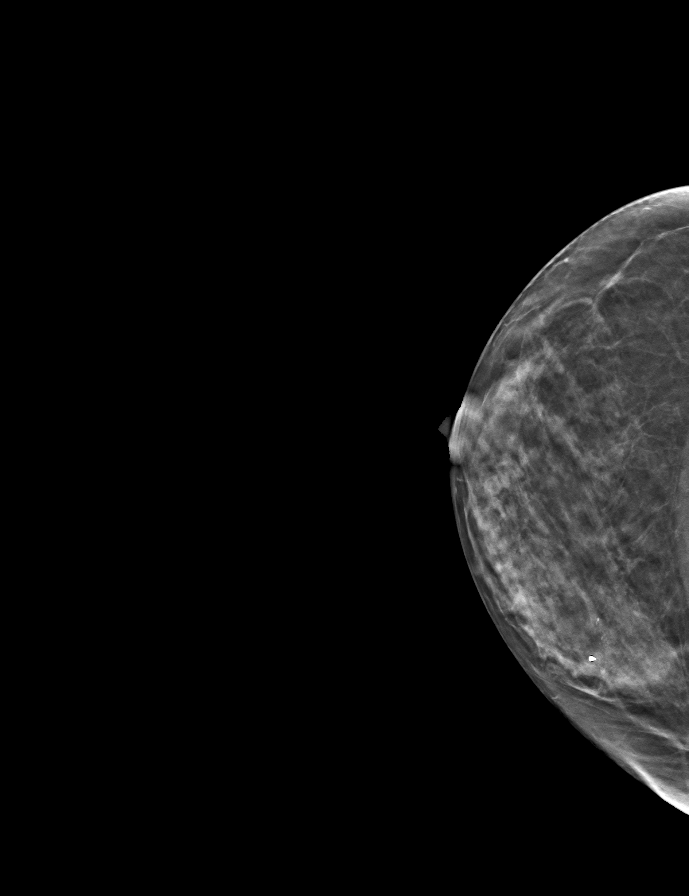

[9 of 24 positions shown; findings below may reference images not displayed]

ACR Breast Density Category c: The breast tissue is heterogeneously
dense, which may obscure small masses.
FINDINGS: In the right breast a mass and a group of calcifications requires
further evaluation.

In the left breast a group of calcifications requires further
evaluation.

Images were processed with CAD.
IMPRESSION: Further evaluation is suggested for possible mass and group of
calcifications in the right breast.

Further evaluation is suggested for possible calcifications in the
left breast.

RECOMMENDATION:
Diagnostic mammogram and possibly ultrasound of both breasts.
(Code:[id number])

The patient will be contacted regarding the findings, and additional
imaging will be scheduled.

BI-RADS CATEGORY  0: Incomplete. Need additional imaging evaluation
and/or prior mammograms for comparison.

## 2021-10-26 ENCOUNTER — Other Ambulatory Visit: Payer: Self-pay

## 2021-10-26 DIAGNOSIS — R921 Mammographic calcification found on diagnostic imaging of breast: Secondary | ICD-10-CM

## 2021-10-26 DIAGNOSIS — N6310 Unspecified lump in the right breast, unspecified quadrant: Secondary | ICD-10-CM

## 2021-12-02 ENCOUNTER — Ambulatory Visit: Payer: Self-pay

## 2021-12-08 ENCOUNTER — Ambulatory Visit
Admission: RE | Admit: 2021-12-08 | Discharge: 2021-12-08 | Disposition: A | Discharge status: Home / Self Care | Payer: Self-pay | Source: Ambulatory Visit | Attending: Obstetrics and Gynecology | Admitting: Obstetrics and Gynecology

## 2021-12-08 ENCOUNTER — Ambulatory Visit: Payer: Self-pay | Attending: Hematology and Oncology | Admitting: *Deleted

## 2021-12-08 VITALS — BP 120/71 | HR 83 | Resp 18 | Wt 195.0 lb

## 2021-12-08 DIAGNOSIS — Z1239 Encounter for other screening for malignant neoplasm of breast: Secondary | ICD-10-CM

## 2021-12-08 DIAGNOSIS — R921 Mammographic calcification found on diagnostic imaging of breast: Secondary | ICD-10-CM

## 2021-12-08 DIAGNOSIS — N6310 Unspecified lump in the right breast, unspecified quadrant: Secondary | ICD-10-CM | POA: Insufficient documentation

## 2021-12-08 NOTE — Patient Instructions (Signed)
Explained breast self awareness with Fredirick Lathe. Patient did not need a Pap smear today due to patient has a history of a hysterectomy for benign reasons. Let patient know that she doesn't need any further Pap smears due to her history of a hysterectomy for benign reasons. Referred patient to the [facility] for a diagnostic mammogram per recommendation. Appointment scheduled Tuesday, December 08, 2021 at 1020. Patient aware of appointment and will be there. Discussed smoking cessation with patient. Referred to the [facility] Quitline. Fredirick Lathe verbalized understanding. ? ?Pamella Samons, Arvil Chaco, RN ?9:12 AM ? ? ? ? ?

## 2021-12-08 NOTE — Progress Notes (Signed)
[name] is a 56 y.o. female who presents to [facility] clinic today with complaint of pain that starts in her back and shoots to her left nipple x 2 months. Patient stated the pain has occurred 4-5 times. Patient rates the pain at a 7-8 out of 10. Patient had a diagnostic mammogram completed 11/26/2020 that a bilateral diagnostic mammogram was recommended for follow up in one year. Patient presents to [facility] clinic today for one year follow up. ?  ?Pap Smear: Pap smear not completed today. Last Pap smear was 20 years ago and was normal per patient. Per patient has no history of an abnormal Pap smear. Per patient has a history of a hysterectomy for fibroids around 10-15 years ago. Patient doesn't need any further Pap smears due to her history of a hysterectomy for benign reasons per BCCCP and ASCCP guidelines. Last Pap smear result is not available in Epic. ? ?Physical exam: ?Breasts ?Right breast slightly larger than left breast that per patient she has not noticed any changes. No skin abnormalities bilateral breasts. No nipple retraction bilateral breasts. No nipple discharge bilateral breasts. No lymphadenopathy. No lumps palpated bilateral breasts. No complaints of pain or tenderness on exam. ? ?MS DIGITAL SCREENING TOMO BILATERAL ? ?Result Date: 10/11/2019 ?CLINICAL DATA:  Screening. EXAM: DIGITAL SCREENING BILATERAL MAMMOGRAM WITH TOMO AND CAD COMPARISON:  None. ACR Breast Density Category c: The breast tissue is heterogeneously dense, which may obscure small masses. FINDINGS: In the right breast a mass and a group of calcifications requires further evaluation. In the left breast a group of calcifications requires further evaluation. Images were processed with CAD. IMPRESSION: Further evaluation is suggested for possible mass and group of calcifications in the right breast. Further evaluation is suggested for possible calcifications in the left breast. RECOMMENDATION: Diagnostic mammogram and possibly  ultrasound of both breasts. (Code:[id number]-B-85M) The patient will be contacted regarding the findings, and additional imaging will be scheduled. BI-RADS CATEGORY  0: Incomplete. Need additional imaging evaluation and/or prior mammograms for comparison. Electronically Signed   By: Frederico Hamman M.D.   On: 10/11/2019 09:16  ? ?MS DIGITAL DIAG TOMO BILAT ? ?Result Date: 11/26/2020 ?CLINICAL DATA:  Here for follow-up of probably benign calcifications in both breasts as well as a circumscribed mass in the right breast. EXAM: DIGITAL DIAGNOSTIC BILATERAL MAMMOGRAM WITH TOMOSYNTHESIS AND CAD; ULTRASOUND RIGHT BREAST LIMITED TECHNIQUE: Bilateral digital diagnostic mammography and breast tomosynthesis was performed. The images were evaluated with computer-aided detection.; Targeted ultrasound examination of the right breast was performed COMPARISON:  Previous exam(s). ACR Breast Density Category c: The breast tissue is heterogeneously dense, which may obscure small masses. FINDINGS: Magnification views of the right breast demonstrate grouped punctate calcifications in the upper outer right breast at posterior depth which span approximately 9 mm. These are not significantly changed since 10/22/2019. Previously described grouped microcalcifications at anterior depth are less conspicuous on today's exam compared to 10/22/2019. Magnification views of the left breast demonstrate regional punctate calcifications in the upper outer left breast at posterior depth near the patient's prior benign biopsy which span 26 mm. These are not significantly changed since 10/22/2019, accounting for calcifications which were removed at biopsy. Grouped punctate microcalcifications inferior to the site of biopsy span 3 mm and are best seen on the ML view. These are not significantly changed since 10/22/2019. Targeted right breast ultrasound was performed. At 9 o'clock 3 cm from the nipple an oval circumscribed hypoechoic mass measures 5 x 6 x 3 mm.  This is not significantly changed since 10/22/2019. IMPRESSION: 1. Grouped calcifications in the right breast are not significantly changed or are less conspicuous when compared to 10/22/2019 and remain probably benign. 2. Grouped microcalcifications in the left breast are not significantly changed since 10/22/2019 and remain probably benign. 3. Circumscribed mass in the right breast is not significantly changed since 10/22/2019 and remains probably benign. RECOMMENDATION: Recommend bilateral diagnostic mammogram and right breast ultrasound in 12 months to document 2 years of stability. I have discussed the findings and recommendations with the patient. If applicable, a reminder letter will be sent to the patient regarding the next appointment. BI-RADS CATEGORY  3: Probably benign. Electronically Signed   By: Romona Curlsyler  Litton M.D.   On: 11/26/2020 11:25  ? ?MS DIGITAL DIAG TOMO BILAT ? ?Result Date: 05/08/2020 ?CLINICAL DATA:  BI-RADS 3 follow-up of bilateral breast calcifications. LEFT-sided stereotactic biopsy with benign results. RIGHT-sided BI-RADS 3 follow-up of a RIGHT breast mass. EXAM: DIGITAL DIAGNOSTIC BILATERAL MAMMOGRAM WITH TOMO AND CAD; ULTRASOUND RIGHT BREAST LIMITED COMPARISON:  Previous exam(s). ACR Breast Density Category c: The breast tissue is heterogeneously dense, which may obscure small masses. FINDINGS: RIGHT: Spot magnification views of the RIGHT breast demonstrate unchanged scattered and occasionally grouped punctate calcifications in the RIGHT upper outer breast. There is a 3-4 mm group posteriorly of punctate calcifications as well as an additional 3 mm group at anterior depth. There are several scattered coarse calcifications at middle depth. The posterior group is more amorphous in appearance on CC view, unchanged in comparison to prior. In the RIGHT outer breast, there is revisualization of an oval circumscribed mass with an associated coarse calcification. Targeted ultrasound was performed  of the RIGHT breast at 9 o'clock. This redemonstrates an oval hypoechoic mass with circumscribed margins at 9 o'clock 3 cm from the nipple which measures 6 x 7 x 4 mm. It previously measured 6 x 6 x 3 mm. Differences in measurement are likely due to differences in scan plane technique. LEFT: Spot magnification views of the LEFT outer breast demonstrate interval decrease of a group of punctate calcifications status post stereotactic biopsy with COIL clip placement. Stereotactic biopsy demonstrated benign results. There is an additional 3 mm adjacent group of punctate calcifications located posterior to the COIL biopsy clip, best appreciated on ML view. These are unchanged comparison to prior. There is a third group of 3 mm punctate calcifications located inferior to the biopsy clip, best appreciated on ML view. These are unchanged in comparison to prior. No new suspicious mass or calcification bilaterally. Mammographic images were processed with CAD. IMPRESSION: 1. Unchanged scattered groups of calcifications in the LEFT upper outer breast. One group has previously been sampled with benign results. Recommend follow-up mammogram with spot magnification views of the LEFT breast in 6 months to assess for continued stability. 2. Unchanged scattered groups of calcifications in the RIGHT upper outer breast. Recommend follow-up mammogram with spot magnification views of the RIGHT breast in 6 months to assess for continued stability. 3. Unchanged probably benign 6 mm RIGHT breast mass at 9 o'clock. Recommend follow-up RIGHT breast ultrasound in 6 months to assess for stability. RECOMMENDATION: Bilateral diagnostic mammograms and a RIGHT-sided ultrasound in 6 months. This will establish 1 year of stability of bilateral calcifications and RIGHT breast mass. I have discussed the findings and recommendations with the patient. If applicable, a reminder letter will be sent to the patient regarding the next appointment. BI-RADS  CATEGORY  3: Probably benign. Electronically Signed  By: Meda Klinefelter MD   On: 05/08/2020 10:16  ? ?MS DIGITAL DIAG TOMO BILAT ? ?Result Date: 10/22/2019 ?CLINICAL DATA:  Recall from screening to evaluat

## 2023-05-12 ENCOUNTER — Ambulatory Visit
Admission: RE | Admit: 2023-05-12 | Discharge: 2023-05-12 | Disposition: A | Discharge status: Home / Self Care | Payer: Medicare PPO | Source: Ambulatory Visit

## 2023-05-12 VITALS — BP 117/68 | HR 77 | Temp 98.9°F | Resp 18

## 2023-05-12 DIAGNOSIS — S46911A Strain of unspecified muscle, fascia and tendon at shoulder and upper arm level, right arm, initial encounter: Secondary | ICD-10-CM

## 2023-05-12 MED ORDER — IBUPROFEN 600 MG PO TABS: 600.0000 mg | ORAL_TABLET | Freq: Four times a day (QID) | ORAL | 0 refills | Status: AC | PRN

## 2023-05-12 MED ORDER — KETOROLAC TROMETHAMINE 30 MG/ML IJ SOLN
30.0000 mg | Freq: Once | INTRAMUSCULAR | Status: AC
  Administered 2023-05-12: 30 mg via INTRAMUSCULAR

## 2023-05-12 NOTE — ED Provider Notes (Signed)
[facility]    CSN: [id number] Arrival date & time: 05/12/23  1056      History   Chief Complaint Chief Complaint  Patient presents with   Shoulder Pain    HPI Alexis Sanford is a 57 y.o. female.   HPI  57 year old female with a past medical history significant for trigeminal neuralgia, neuropathy, and chronic tension type headaches presents for evaluation of pain in her right shoulder for the last 3 to 4 days.  This began after she did some heavy lifting.  She has been applying ice and using IcyHot without any improvement of pain.  She takes baclofen daily on a schedule and reports that is not helping.  She denies any numbness, tingling, or weakness in her right arm or hand.  Past Medical History:  Diagnosis Date   Neuropathy    Trigeminal neuralgia     Patient Active Problem List   Diagnosis Date Noted   Tobacco use disorder 10/04/2017   Chronic tension-type headache, not intractable 06/01/2017   Trigeminal neuralgia of right side of face 09/20/2016    Past Surgical History:  Procedure Laterality Date   ABDOMINAL HYSTERECTOMY     partail   BREAST BIOPSY Left 10/29/2019   Affirm Bx-"coil" clip-path pending   facial neuro surgery      OB History   No obstetric history on file.      Home Medications    Prior to Admission medications   Medication Sig Start Date End Date Taking? Authorizing Provider  ibuprofen (ADVIL) 600 MG tablet Take 1 tablet (600 mg total) by mouth every 6 (six) hours as needed. 05/12/23  Yes Becky Augusta, NP  pantoprazole (PROTONIX) 40 MG tablet Take 1 tablet by mouth daily. 03/01/23  Yes [provider]  acetaminophen (TYLENOL) 500 MG tablet Take by mouth.    [provider]  albuterol (VENTOLIN HFA) 108 (90 Base) MCG/ACT inhaler Inhale into the lungs.    [provider]  baclofen (LIORESAL) 10 MG tablet Take by mouth. 06/17/21   [provider]  clonazePAM (KLONOPIN) 0.5 MG tablet Take 0.5 mg  by mouth 2 (two) times daily as needed for anxiety. Patient not taking: Reported on 12/08/2021    [provider]  fexofenadine (ALLEGRA) 180 MG tablet Take by mouth.    [provider]  gabapentin (NEURONTIN) 300 MG capsule Take 300 mg by mouth 3 (three) times daily.    [provider]  gabapentin (NEURONTIN) 300 MG capsule Take by mouth. 06/17/21   [provider]  lamoTRIgine (LAMICTAL) 25 MG tablet Take 50 mg by mouth daily.    [provider]  Lidocaine HCl 4 % GEL Apply every 12 hours alternating every 6 hours with voltaren gel 02/19/21   [provider]  nortriptyline (PAMELOR) 10 MG capsule Take by mouth. 06/17/21 03/03/22  [provider]  Oxcarbazepine (TRILEPTAL) 300 MG tablet Take 300 mg by mouth 2 (two) times daily. Patient not taking: Reported on 12/08/2021    [provider]    Family History Family History  Problem Relation Age of Onset   Breast cancer Neg Hx     Social History Social History   Tobacco Use   Smoking status: Every Day    Current packs/day: 0.25    Types: Cigarettes   Smokeless tobacco: Never  Vaping Use   Vaping status: Never Used  Substance Use Topics   Alcohol use: Never   Drug use: Never  Allergies   Lacosamide, Nortriptyline, Asa [aspirin], Indomethacin, Sulfa antibiotics, Sulfasalazine, and Lamotrigine   Review of Systems Review of Systems  Musculoskeletal:  Positive for arthralgias and myalgias.  Neurological:  Negative for weakness and numbness.     Physical Exam Triage Vital Signs ED Triage Vitals  Encounter Vitals Group     BP      Systolic BP Percentile      Diastolic BP Percentile      Pulse      Resp      Temp      Temp src      SpO2      Weight      Height      Head Circumference      Peak Flow      Pain Score      Pain Loc      Pain Education      Exclude from Growth Chart    No data found.  Updated Vital Signs BP 117/68 (BP Location:  Left Arm)   Pulse 77   Temp 98.9 F (37.2 C) (Oral)   Resp 18   SpO2 100%   Visual Acuity Right Eye Distance:   Left Eye Distance:   Bilateral Distance:    Right Eye Near:   Left Eye Near:    Bilateral Near:     Physical Exam Vitals and nursing note reviewed.  Constitutional:      Appearance: Normal appearance. She is not ill-appearing.  HENT:     Head: Normocephalic and atraumatic.  Musculoskeletal:        General: Tenderness present. No swelling or signs of injury.  Skin:    General: Skin is warm and dry.     Capillary Refill: Capillary refill takes less than 2 seconds.     Findings: No bruising.  Neurological:     General: No focal deficit present.     Mental Status: She is alert and oriented to person, place, and time.      UC Treatments / Results  Labs (all labs ordered are listed, but only abnormal results are displayed) Labs Reviewed - No data to display  EKG   Radiology No results found.  Procedures Procedures (including critical care time)  Medications Ordered in UC Medications  ketorolac (TORADOL) 30 MG/ML injection 30 mg (has no administration in time range)    Initial Impression / Assessment and Plan / UC Course  I have reviewed the triage vital signs and the nursing notes.  Pertinent labs & imaging results that were available during my care of the patient were reviewed by me and considered in my medical decision making (see chart for details).   Patient is a nontoxic-appearing 57 year old female presenting for evaluation of pain in her right shoulder girdle x 3 to 4 days.  On exam her shoulder is in normal anatomical position and she has no pain with palpation of the clavicle, acromion process, or spinous process of the right scapula.  She does have some tension in the superior aspect of the trapezius muscle as well as in her right deltoid.  Her range of motion is limited secondary to pain.  Bilateral grips and upper extremity strength are 5/5  bilaterally.  Radial and ulnar pulses are 2+ and cap refills less than 3 seconds.  I suspect that she has strained her shoulder.  She takes baclofen at baseline so I will have her continue that and I will give an IM injection of Toradol in clinic  and discharged home on ibuprofen with a sling and home physical therapy.  If her pain is not improved in the next 2 to 3 days she should follow-up with orthopedics.   Final Clinical Impressions(s) / UC Diagnoses   Final diagnoses:  Strain of right shoulder, initial encounter     Discharge Instructions      Continue take your baclofen as previously prescribed.  Start taking ibuprofen 6 oh milligrams every 6 hours with food.  The anti-inflammatory effect combined with muscle laxer may help improve your pain.  Apply moist heat to your right shoulder girdle for 20 minutes at a time 2-3 times a day.  This will improve blood flow to the shoulder and should aid in healing the muscle.  Perform the shoulder range of motion exercises twice daily to help maintain mobility in her shoulder.  Wear the sling except when sleeping or showering to help rest your right shoulder.  You may take it off when you do shoulder range of motion exercises well.  If your pain is not improved in the next 2 to 3 days I recommend that you follow-up with orthopedics for further evaluation.     ED Prescriptions     Medication Sig Dispense Auth. Provider   ibuprofen (ADVIL) 600 MG tablet Take 1 tablet (600 mg total) by mouth every 6 (six) hours as needed. 30 tablet Becky Augusta, NP      PDMP not reviewed this encounter.   Becky Augusta, NP 05/12/23 1147

## 2023-05-12 NOTE — ED Triage Notes (Signed)
Pt presents with right shoulder pain x 3-4 days. Pt denies any injury, but states did lift something heavy.

## 2023-05-12 NOTE — Discharge Instructions (Addendum)
Continue take your baclofen as previously prescribed.  Start taking ibuprofen 6 oh milligrams every 6 hours with food.  The anti-inflammatory effect combined with muscle laxer may help improve your pain.  Apply moist heat to your right shoulder girdle for 20 minutes at a time 2-3 times a day.  This will improve blood flow to the shoulder and should aid in healing the muscle.  Perform the shoulder range of motion exercises twice daily to help maintain mobility in her shoulder.  Wear the sling except when sleeping or showering to help rest your right shoulder.  You may take it off when you do shoulder range of motion exercises well.  If your pain is not improved in the next 2 to 3 days I recommend that you follow-up with orthopedics for further evaluation.

## 2023-05-23 ENCOUNTER — Encounter: Payer: Self-pay | Admitting: Family Medicine

## 2023-05-30 ENCOUNTER — Other Ambulatory Visit: Payer: Self-pay | Admitting: Family Medicine

## 2023-05-30 DIAGNOSIS — N644 Mastodynia: Secondary | ICD-10-CM

## 2023-05-30 DIAGNOSIS — Z1231 Encounter for screening mammogram for malignant neoplasm of breast: Secondary | ICD-10-CM

## 2023-06-08 ENCOUNTER — Inpatient Hospital Stay
Admission: RE | Admit: 2023-06-08 | Discharge: 2023-06-08 | Disposition: A | Discharge status: Home / Self Care | Payer: Medicare PPO | Source: Ambulatory Visit | Attending: Family Medicine | Admitting: Family Medicine

## 2023-06-08 ENCOUNTER — Ambulatory Visit
Admission: RE | Admit: 2023-06-08 | Discharge: 2023-06-08 | Disposition: A | Discharge status: Home / Self Care | Payer: Medicare PPO | Source: Ambulatory Visit | Attending: Family Medicine | Admitting: Family Medicine

## 2023-06-08 DIAGNOSIS — N644 Mastodynia: Secondary | ICD-10-CM

## 2023-06-08 DIAGNOSIS — Z1231 Encounter for screening mammogram for malignant neoplasm of breast: Secondary | ICD-10-CM | POA: Diagnosis present

## 2023-09-30 ENCOUNTER — Ambulatory Visit
Admission: RE | Admit: 2023-09-30 | Discharge: 2023-09-30 | Disposition: A | Discharge status: Home / Self Care | Payer: Medicare PPO | Attending: Family Medicine | Admitting: Family Medicine

## 2023-09-30 ENCOUNTER — Other Ambulatory Visit: Payer: Self-pay | Admitting: Family Medicine

## 2023-09-30 ENCOUNTER — Ambulatory Visit
Admission: RE | Admit: 2023-09-30 | Discharge: 2023-09-30 | Disposition: A | Discharge status: Home / Self Care | Payer: Medicare PPO | Source: Ambulatory Visit | Attending: Family Medicine | Admitting: Family Medicine

## 2023-09-30 DIAGNOSIS — M19011 Primary osteoarthritis, right shoulder: Secondary | ICD-10-CM
